# Patient Record
Sex: Male | Born: 1965 | Race: Black or African American | Hispanic: No | Marital: Single | Smoking: Current some day smoker
Health system: Southern US, Community
[De-identification: ages and names within clinical notes are randomized; demographics above are authoritative.]

## PROBLEM LIST (undated history)

## (undated) DIAGNOSIS — J45909 Unspecified asthma, uncomplicated: Secondary | ICD-10-CM

## (undated) DIAGNOSIS — T7840XA Allergy, unspecified, initial encounter: Secondary | ICD-10-CM

## (undated) HISTORY — DX: Unspecified asthma, uncomplicated: J45.909

## (undated) HISTORY — PX: LEG SURGERY: SHX1003

## (undated) HISTORY — DX: Allergy, unspecified, initial encounter: T78.40XA

---

## 1998-11-17 ENCOUNTER — Emergency Department (HOSPITAL_COMMUNITY): Admission: EM | Admit: 1998-11-17 | Discharge: 1998-11-17 | Payer: Self-pay | Admitting: Emergency Medicine

## 2004-10-09 ENCOUNTER — Emergency Department (HOSPITAL_COMMUNITY): Admission: EM | Admit: 2004-10-09 | Discharge: 2004-10-09 | Payer: Self-pay | Admitting: Emergency Medicine

## 2004-12-07 ENCOUNTER — Ambulatory Visit (HOSPITAL_COMMUNITY): Admission: RE | Admit: 2004-12-07 | Discharge: 2004-12-07 | Payer: Self-pay | Admitting: Orthopedic Surgery

## 2004-12-07 ENCOUNTER — Ambulatory Visit (HOSPITAL_BASED_OUTPATIENT_CLINIC_OR_DEPARTMENT_OTHER): Admission: RE | Admit: 2004-12-07 | Discharge: 2004-12-07 | Payer: Self-pay | Admitting: Orthopedic Surgery

## 2008-08-08 ENCOUNTER — Emergency Department (HOSPITAL_COMMUNITY): Admission: EM | Admit: 2008-08-08 | Discharge: 2008-08-09 | Payer: Self-pay | Admitting: Emergency Medicine

## 2008-09-02 ENCOUNTER — Ambulatory Visit (HOSPITAL_BASED_OUTPATIENT_CLINIC_OR_DEPARTMENT_OTHER): Admission: RE | Admit: 2008-09-02 | Discharge: 2008-09-02 | Payer: Self-pay | Admitting: Orthopedic Surgery

## 2009-04-12 ENCOUNTER — Emergency Department (HOSPITAL_COMMUNITY): Admission: EM | Admit: 2009-04-12 | Discharge: 2009-04-12 | Payer: Self-pay | Admitting: Emergency Medicine

## 2009-10-16 ENCOUNTER — Emergency Department (HOSPITAL_COMMUNITY): Admission: EM | Admit: 2009-10-16 | Discharge: 2009-10-16 | Payer: Self-pay | Admitting: Emergency Medicine

## 2010-04-06 LAB — RAPID URINE DRUG SCREEN, HOSP PERFORMED
Amphetamines: NOT DETECTED
Opiates: NOT DETECTED
Tetrahydrocannabinol: NOT DETECTED

## 2010-04-06 LAB — POCT CARDIAC MARKERS
Myoglobin, poc: 85.8 ng/mL (ref 12–200)
Troponin i, poc: <0.05 ng/mL (ref 0.00–0.09)
Troponin i, poc: <0.05 ng/mL (ref 0.00–0.09)

## 2010-04-06 LAB — URINALYSIS, ROUTINE W REFLEX MICROSCOPIC
Bilirubin Urine: NEGATIVE
Hgb urine dipstick: NEGATIVE
Ketones, ur: NEGATIVE mg/dL
Nitrite: NEGATIVE
Protein, ur: NEGATIVE mg/dL
Urobilinogen, UA: 0.2 mg/dL (ref 0.0–1.0)

## 2010-04-06 LAB — DIFFERENTIAL
Basophils Absolute: 0 10*3/uL (ref 0.0–0.1)
Basophils Relative: 1 % (ref 0–1)
Eosinophils Absolute: 0.3 10*3/uL (ref 0.0–0.7)
Eosinophils Relative: 6 % — ABNORMAL HIGH (ref 0–5)
Monocytes Absolute: 0.4 10*3/uL (ref 0.1–1.0)
Monocytes Relative: 7 % (ref 3–12)

## 2010-04-06 LAB — POCT I-STAT, CHEM 8
Calcium, Ion: 1.27 mmol/L (ref 1.12–1.32)
Chloride: 108 mEq/L (ref 96–112)
HCT: 44 % (ref 39.0–52.0)
Potassium: 3.9 mEq/L (ref 3.5–5.1)
Sodium: 141 mEq/L (ref 135–145)

## 2010-04-06 LAB — CBC
HCT: 41 % (ref 39.0–52.0)
Hemoglobin: 14.2 g/dL (ref 13.0–17.0)
MCH: 30.7 pg (ref 26.0–34.0)
MCHC: 34.6 g/dL (ref 30.0–36.0)
RDW: 13.3 % (ref 11.5–15.5)

## 2010-06-06 NOTE — Op Note (Signed)
Shane Townsend, Shane Townsend            ACCOUNT NO.:  [id number]   MEDICAL RECORD NO.:  [id number]          PATIENT TYPE:  AMB   LOCATION:  DSC                          FACILITY:  [facility]   PHYSICIAN:  Rodney A. Mortenson, M.D.DATE OF BIRTH:  July 26, 1965   DATE OF PROCEDURE:  09/02/2008  DATE OF DISCHARGE:                               OPERATIVE REPORT   JUSTIFICATION:  A 43-year male with bilateral carpal tunnel much worse  on the right than on the left.  On electrodiagnostic studies, he has  severe carpal tunnel on the right and he is now admitted for a surgical  decompression.  Complications discussed preoperatively.  Questions  answered and encouraged and the patient wishes to proceed.   JUSTIFICATION FOR OUTPATIENT SURGERY:  Minimal morbidity.   PREOPERATIVE DIAGNOSIS:  Severe right carpal tunnel.   POSTOPERATIVE DIAGNOSIS:  Severe right carpal tunnel.   OPERATION:  Release of transverse carpal ligament, right wrist.   SURGEON:  Rodney A. Chaney Malling, M.D.   ANESTHESIA:  LMA.   PROCEDURE:  The patient was placed on the operating table in supine  position.  Pneumatic tourniquet about the right upper arm.  The right  upper extremity was prepped with DuraPrep, draped in the usual manner.  Loupe magnification was used throughout.  A lazy S incision started in  mid palmar space and carried proximal to the volar wrist crease.  Skin  edges were retracted.  Fascia of forearm was then opened just lateral to  the palmaris and the median nerve was identified and isolated.  Curved  Mayo scissors placed underneath the transverse ligament but above the  median nerve to protect the median nerve.  Under direct vision, the  transverse ligament was seen and this was released directly under loupe  magnification.  Complete decompression of the transverse ligament was  achieved.  This was markedly thickened and the median nerve was  flattened underneath the ligament and there was loss of vascular  markings.  No other space-occupying lesions were seen.  Excellent  decompression distally and proximally was done.  Great care was taken to  extend all border of the carpal canal at the level of the ring finger.  The wound was infiltrated with Marcaine.  The skin edges were closed  with nylon sutures.  A large bulky pressure dressing was applied.  The  patient returned to recovery room in excellent condition.  Technically,  this went extremely well.   DISPOSITION:  1. Percocet for pain.  2. Continue wrist immobilizer.  3. To my office on Wednesday.  4. Usual postop instructions.      Rodney A. Chaney Malling, M.D.  Electronically Signed     RAM/MEDQ  D:  09/02/2008  T:  09/02/2008  Job:  [id number]

## 2010-06-09 NOTE — Op Note (Signed)
NAMEWARNIE, Shane Townsend            ACCOUNT NO.:  [id number]   MEDICAL RECORD NO.:  [id number]          PATIENT TYPE:  AMB   LOCATION:  DSC                          FACILITY:  [facility]   PHYSICIAN:  Rodney A. Mortenson, M.D.DATE OF BIRTH:  1965-04-22   DATE OF PROCEDURE:  12/07/2004  DATE OF DISCHARGE:                                 OPERATIVE REPORT   PREOPERATIVE DIAGNOSIS:  Large hematoma, left thigh, chronic.   POSTOPERATIVE DIAGNOSIS:  Large hematoma, left thigh, chronic.   OPERATION:  Incision and drainage of a large hematoma, left thigh, pulse  lavage, and application of Wound VAC suction system.   SURGEON:  Lenard Galloway. Chaney Malling, M.D.   ASSISTANT:  Legrand Pitts. Duffy, P.A.-C.   ANESTHESIA:  General.   DESCRIPTION OF PROCEDURE:  The patient was placed on the operating table in  the supine position.  After satisfactory general anesthesia, the left thigh  was prepped with DuraPrep and draped out in the usual manner.  An incision  was made from one end of the hematoma to the other in a longitudinal manner.  A large amount of blood was evacuated from the hematoma.  There was a  membrane around the hematoma and it kept recurring.  A pulse lavage was  inserted and the wound was copiously irrigated with saline solution.  A  Wound VAC was then brought to the table.  A series of adhesive tapes and  sheets were applied to the thigh.  The Wound VAC was then cut to fit the  large defect.  This was placed on the flaps.  The suction device was placed  on top and another sheet placed on top to give an air tight seal.  The Wound  VAC pump was then started and the wound collapsed very nicely.  There was an  air tight seal.  Technically, this went extremely care.   FOLLOW UP CARE:  1.  To my office on Wednesday.  2.  The patient is to have changes of the Wound VAC sponge three times a      week at home.  3.  Keflex.           ______________________________  Lenard Galloway Chaney Malling, M.D.     RAM/MEDQ  D:  12/07/2004  T:  12/07/2004  Job:  [id number]

## 2010-08-28 ENCOUNTER — Ambulatory Visit (HOSPITAL_BASED_OUTPATIENT_CLINIC_OR_DEPARTMENT_OTHER)
Admission: RE | Admit: 2010-08-28 | Discharge: 2010-08-28 | Disposition: A | Discharge status: Home / Self Care | Payer: BC Managed Care – PPO | Source: Ambulatory Visit | Attending: Orthopedic Surgery | Admitting: Orthopedic Surgery

## 2010-08-28 DIAGNOSIS — Z01812 Encounter for preprocedural laboratory examination: Secondary | ICD-10-CM | POA: Insufficient documentation

## 2010-08-28 DIAGNOSIS — G5600 Carpal tunnel syndrome, unspecified upper limb: Secondary | ICD-10-CM | POA: Insufficient documentation

## 2010-08-28 DIAGNOSIS — Z1810 Retained metal fragments, unspecified: Secondary | ICD-10-CM | POA: Insufficient documentation

## 2010-08-28 LAB — POCT HEMOGLOBIN-HEMACUE: Hemoglobin: 14.2 g/dL (ref 13.0–17.0)

## 2010-10-24 NOTE — Op Note (Signed)
NAME:  Shane Townsend, Shane Townsend NO.:  [id number]  MEDICAL RECORD NO.:  [id number]  LOCATION:                                 FACILITY:  PHYSICIAN:  Cindee Salt, M.D.            DATE OF BIRTH:  DATE OF PROCEDURE:  08/28/2010 DATE OF DISCHARGE:                              OPERATIVE REPORT   PREOPERATIVE DIAGNOSES:  Carpal tunnel syndrome, left hand.  Foreign body of the left ring finger.  POSTOPERATIVE DIAGNOSES:  Carpal tunnel syndrome, left hand.  Foreign body of the left ring finger.  OPERATION:  Carpal tunnel release with excision of foreign body of the left ring finger, left hand.  SURGEON:  Cindee Salt, MD  ANESTHESIA:  Forearm based IV regional.  ANESTHESIOLOGIST:  Janetta Hora. Gelene Mink, MD  HISTORY:  The patient is a 45 year old male with a history of carpal tunnel syndrome.  EMG nerve conduction is positive which has not responded to conservative treatment.  He has elected to undergo surgical decompression to the median nerve.  He has an incidental finding of a foreign body in his left ring finger and desirous of having this removed.  Pre, peri, and postoperative course have been  discussed along with risks and complications.  He is aware that there is no guarantee with the surgery, possibility of infection, recurrence of injury to arteries, nerves, tendons, incomplete relief of symptoms, and dystrophy. In the preoperative area, the patient is seen, the extremity  marked by both the patient and surgeon, and antibiotic given.  PROCEDURE:  The patient was brought to the operating room where a forearm-based IV regional anesthetic was carried out without difficulty. He was prepped using ChloraPrep, supine position, left arm free.  A 3- minute dry time was allowed.  Time-out taken confirming the patient and procedure.  A longitudinal incision was made in the palm carried down through the subcutaneous tissue.  Bleeders were electrocauterized, palmar fascia  was split, superficial palmar arch identified.  The flexor tendon of the little finger identified to the ulnar side of the median nerve.  The carpal retinaculum was incised with sharp dissection.  Right angle and Sewall retractor were placed between the skin and forearm fascia.  The fascia released for approximately a centimeter and half proximal to the wrist crease under direct vision.  A very significant compression to the median nerve was apparent with an area of hyperemia. Tenosynovial tissue was moderately thickened.  No further lesions were identified.  The wound was irrigated.  Skin closed with interrupted 5-0 Vicryl Rapide sutures.  A separate incision was then made in the mid lateral over the left ring finger, carried down through the subcutaneous tissue.  With blunt dissection, the dissection carried down to a foreign body, this was a BB.  A capsule was present about it and this was opened with blunt dissection and the BB removed along with the capsule.  The wound was irrigated.  The skin closed with interrupted 5-0 Vicryl Rapide sutures.  Local infiltration with 0.25% Marcaine without epinephrine was given to each wound, approximately 7 mL was used.  A sterile compressive dressing was applied to the hand and to the fingers with  the index, middle and thumb free.  On deflation of the tourniquet, all fingers were immediately pinked.  He was taken to the recovery room for observation in satisfactory condition.  He will be discharged home to return to the [facility] [place] in 1 week, on Vicodin.          ______________________________ Cindee Salt, M.D.     GK/MEDQ  D:  08/28/2010  T:  08/29/2010  Job:  [id number]  Electronically Signed by Cindee Salt M.D. on 10/24/2010 04:37:34 PM

## 2011-05-19 ENCOUNTER — Emergency Department (HOSPITAL_COMMUNITY)
Admission: EM | Admit: 2011-05-19 | Discharge: 2011-05-19 | Disposition: A | Discharge status: Home / Self Care | Payer: BC Managed Care – PPO | Attending: Emergency Medicine | Admitting: Emergency Medicine

## 2011-05-19 ENCOUNTER — Emergency Department (HOSPITAL_COMMUNITY): Payer: BC Managed Care – PPO

## 2011-05-19 ENCOUNTER — Encounter (HOSPITAL_COMMUNITY): Payer: Self-pay | Admitting: *Deleted

## 2011-05-19 DIAGNOSIS — M25519 Pain in unspecified shoulder: Secondary | ICD-10-CM | POA: Insufficient documentation

## 2011-05-19 DIAGNOSIS — M25511 Pain in right shoulder: Secondary | ICD-10-CM

## 2011-05-19 MED ORDER — KETOROLAC TROMETHAMINE 30 MG/ML IJ SOLN
30.0000 mg | Freq: Once | INTRAMUSCULAR | Status: AC
Start: 2011-05-19 — End: 2011-05-19
  Administered 2011-05-19: 30 mg via INTRAMUSCULAR
  Filled 2011-05-19: qty 1

## 2011-05-19 MED ORDER — OXYCODONE-ACETAMINOPHEN 5-325 MG PO TABS
1.0000 | ORAL_TABLET | Freq: Four times a day (QID) | ORAL | Status: AC | PRN
Start: 2011-05-19 — End: 2011-05-29

## 2011-05-19 MED ORDER — HYDROMORPHONE HCL PF 1 MG/ML IJ SOLN
1.0000 mg | Freq: Once | INTRAMUSCULAR | Status: AC
  Administered 2011-05-19: 1 mg via INTRAMUSCULAR
  Filled 2011-05-19: qty 1

## 2011-05-19 MED ORDER — IBUPROFEN 600 MG PO TABS: 600.0000 mg | ORAL_TABLET | Freq: Four times a day (QID) | ORAL | Status: AC | PRN

## 2011-05-19 NOTE — ED Notes (Signed)
The pt has  Rt shoulder pain  For 3-4 days.  No known injury

## 2011-05-19 NOTE — Discharge Instructions (Signed)
Shoulder Pain The shoulder is a ball and socket joint. The muscles and tendons (rotator cuff) are what keep the shoulder in its joint and stable. This collection of muscles and tendons holds in the head (ball) of the humerus (upper arm bone) in the fossa (cup) of the scapula (shoulder blade). Today no reason was found for your shoulder pain. Often pain in the shoulder may be treated conservatively with temporary immobilization. For example, holding the shoulder in one place using a sling for rest. Physical therapy may be needed if problems continue. HOME CARE INSTRUCTIONS   Apply ice to the sore area for 15 to 20 minutes, 3 to 4 times per day for the first 2 days. Put the ice in a plastic bag. Place a towel between the bag of ice and your skin.   If you have or were given a shoulder sling and straps, do not remove for as long as directed by your caregiver or until you see a caregiver for a follow-up examination. If you need to remove it to shower or bathe, move your arm as little as possible.   Sleep on several pillows at night to lessen swelling and pain.   Only take over-the-counter or prescription medicines for pain, discomfort, or fever as directed by your caregiver.   Keep any follow-up appointments in order to avoid any type of permanent shoulder disability or chronic pain problems.  SEEK MEDICAL CARE IF:   Pain in your shoulder increases or new pain develops in your arm, hand, or fingers.   Your hand or fingers are colder than your other hand.   You do not obtain pain relief with the medications or your pain becomes worse.  SEEK IMMEDIATE MEDICAL CARE IF:   Your arm, hand, or fingers are numb or tingling.   Your arm, hand, or fingers are swollen, painful, or turn white or blue.   You develop chest pain or shortness of breath.  MAKE SURE YOU:   Understand these instructions.   Will watch your condition.   Will get help right away if you are not doing well or get worse.    Document Released: 10/18/2004 Document Revised: 12/28/2010 Document Reviewed: 12/23/2010 [employer] Patient Information 2012 [employer]. 

## 2011-05-19 NOTE — Progress Notes (Signed)
Orthopedic Tech Progress Note Patient Details:  Shane Townsend 1966/01/10 [id number]  Other Ortho Devices Type of Ortho Device: Other (comment) Ortho Device Location: right UE Ortho Device Interventions: Application   Shane Townsend 05/19/2011, 8:32 AM

## 2011-05-19 NOTE — ED Provider Notes (Signed)
History     CSN: [id number]  Arrival date & time 05/19/11  [id number]   First MD Initiated Contact with Patient 05/19/11 [phone]      Chief Complaint  Patient presents with  . Shoulder Pain    (Consider location/radiation/quality/duration/timing/severity/associated sxs/prior treatment) Patient is a 46 y.o. male presenting with shoulder pain. The history is provided by the patient.  Shoulder Pain This is a new problem.  patient's had right shoulder pain for last 3-4 days. Worse with movement. No trauma. He has not had a history of shoulder pain on that side. No numbness or weakness. He took oxycodone at home without relief. No new or strange physical activities. no Chest pain. No Head or neck pain.  History reviewed. No pertinent past medical history.  History reviewed. No pertinent past surgical history.  No family history on file.  History  Substance Use Topics  . Smoking status: Never Smoker   . Smokeless tobacco: Not on file  . Alcohol Use: Yes      Review of Systems  Constitutional: Negative for fever and fatigue.  Musculoskeletal: Negative for back pain, joint swelling and gait problem.       Right shoulder pain  Neurological: Negative for weakness and numbness.    Allergies  Review of patient's allergies indicates no known allergies.  Home Medications   Current Outpatient Rx  Name Route Sig Dispense Refill  . IBUPROFEN 600 MG PO TABS Oral Take 1 tablet (600 mg total) by mouth every 6 (six) hours as needed for pain. 20 tablet 0  . OXYCODONE-ACETAMINOPHEN 5-325 MG PO TABS Oral Take 1-2 tablets by mouth every 6 (six) hours as needed for pain. 20 tablet 0    BP 169/98  Pulse 80  Temp(Src) 98 F (36.7 C) (Oral)  Resp 16  SpO2 95%  Physical Exam  Musculoskeletal:       Decreased range of motion right shoulder. Tenderness anteriorly. Pain with active or passive movement. Neurovascular intact distally. No tenderness over trapezius  Neurological: He is alert.    Skin: Skin is warm. No rash noted.    ED Course  Procedures (including critical care time)  Labs Reviewed - No data to display Dg Shoulder Right  05/19/2011  *RADIOLOGY REPORT*  Clinical Data: Anterior shoulder pain for 3 days.  No known injury.  RIGHT SHOULDER - 2+ VIEW  Comparison: None.  Findings: The mineralization and alignment are normal.  There is no evidence of acute fracture or dislocation.  The subacromial space is preserved.  There are mild acromioclavicular degenerative changes.  IMPRESSION: No acute osseous findings.  Original Report Authenticated By: Gerrianne Scale, M.D.     1. Shoulder pain, right       MDM  Right shoulder pain. Tender anteriorly. Negative x-ray. No clear injury. Patient is neurovascular intact. He'll be given anti-inflammatories and pain meds. He'll be given a shoulder sling to use as needed for comfort for next few days. He'll follow with orthopedics as needed.        Juliet Rude. Rubin Payor, MD 05/19/11 [phone]

## 2012-11-13 IMAGING — CR DG SHOULDER 2+V*R*
3 series · 3 of 3 positions shown · non-contrast
Comparison: None.

CLINICAL DATA: Anterior shoulder pain for 3 days.  No known injury.

RIGHT SHOULDER - 2+ VIEW

[w shoulder ap external righ]
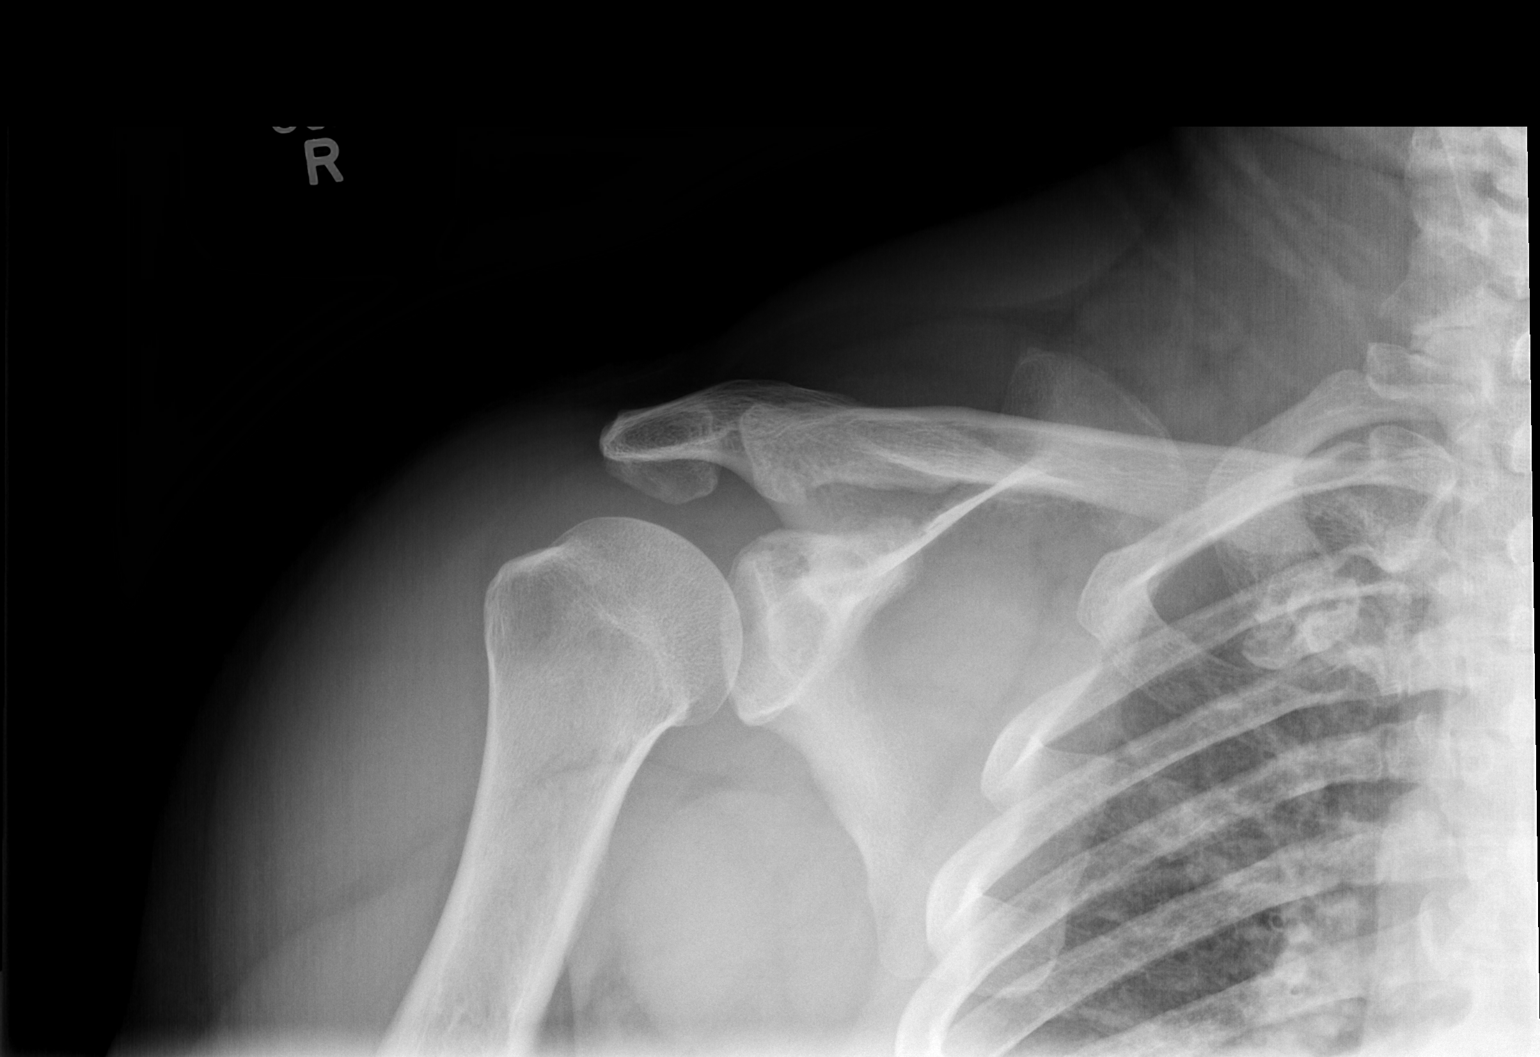

[w shoulder ap internal righ]
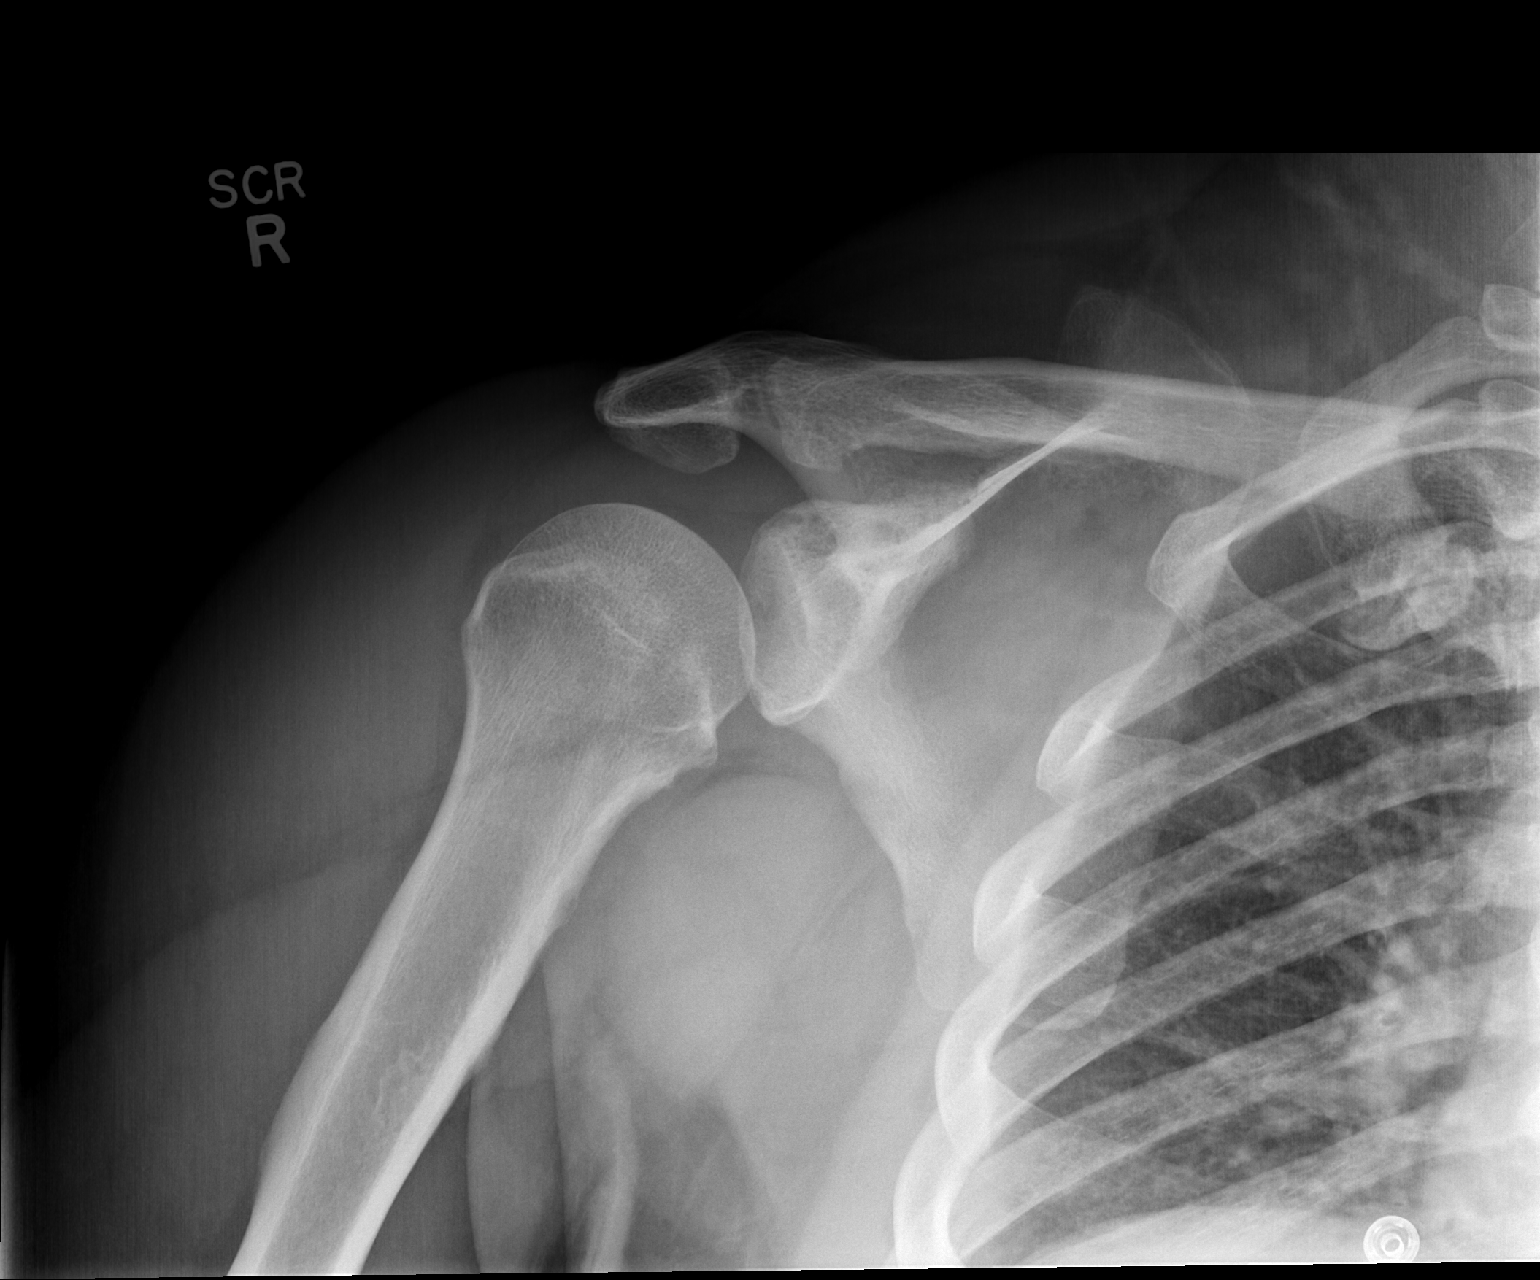

[w shoulder y view right]
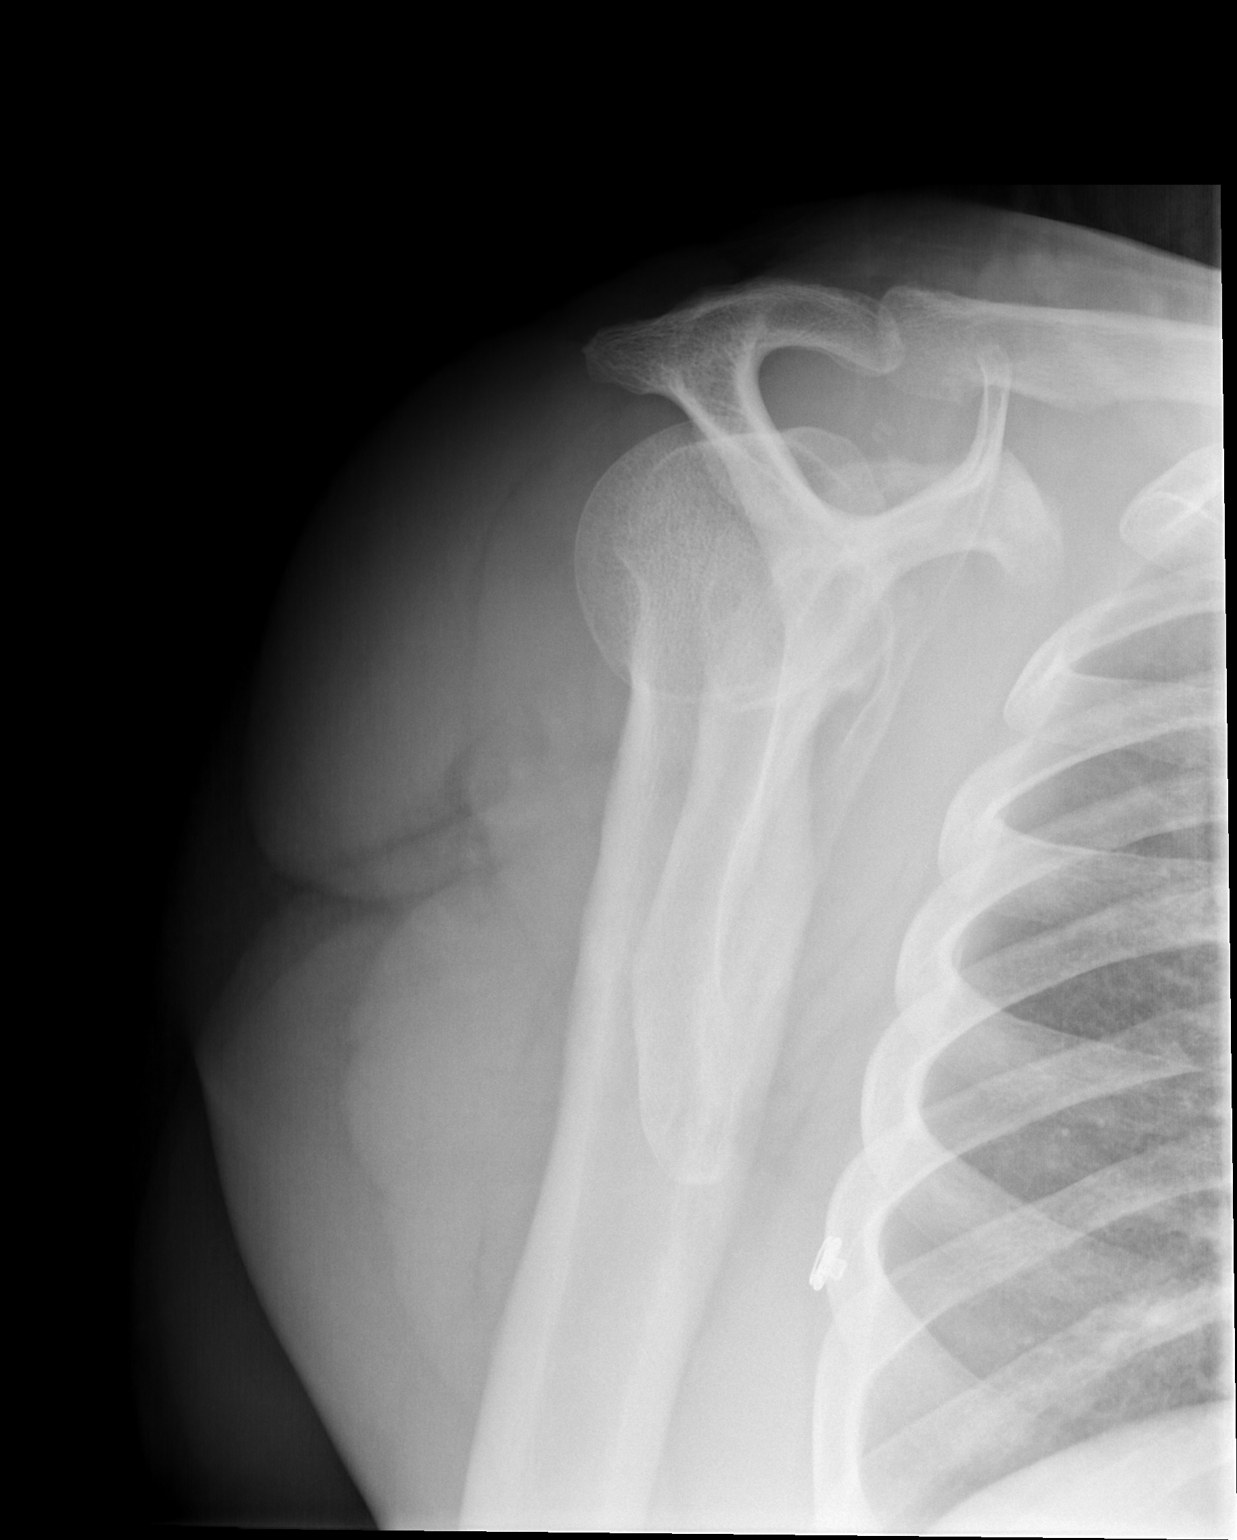

[3 of 3 positions shown; findings below may reference images not displayed]

FINDINGS: The mineralization and alignment are normal.  There is no
evidence of acute fracture or dislocation.  The subacromial space
is preserved.  There are mild acromioclavicular degenerative
changes.
IMPRESSION: No acute osseous findings.

## 2013-06-04 ENCOUNTER — Ambulatory Visit (INDEPENDENT_AMBULATORY_CARE_PROVIDER_SITE_OTHER): Payer: BC Managed Care – PPO | Admitting: Family Medicine

## 2013-06-04 ENCOUNTER — Encounter: Payer: Self-pay | Admitting: Gastroenterology

## 2013-06-04 VITALS — BP 126/74 | HR 64 | Temp 98.1°F | Resp 16 | Ht 62.5 in | Wt 234.2 lb

## 2013-06-04 DIAGNOSIS — R369 Urethral discharge, unspecified: Secondary | ICD-10-CM

## 2013-06-04 DIAGNOSIS — K625 Hemorrhage of anus and rectum: Secondary | ICD-10-CM

## 2013-06-04 DIAGNOSIS — E669 Obesity, unspecified: Secondary | ICD-10-CM

## 2013-06-04 DIAGNOSIS — Z800 Family history of malignant neoplasm of digestive organs: Secondary | ICD-10-CM

## 2013-06-04 DIAGNOSIS — Z113 Encounter for screening for infections with a predominantly sexual mode of transmission: Secondary | ICD-10-CM

## 2013-06-04 LAB — POCT UA - MICROSCOPIC ONLY
Bacteria, U Microscopic: NEGATIVE
Casts, Ur, LPF, POC: NEGATIVE
Crystals, Ur, HPF, POC: NEGATIVE
EPITHELIAL CELLS, URINE PER MICROSCOPY: NEGATIVE
RBC, URINE, MICROSCOPIC: NEGATIVE
YEAST UA: NEGATIVE

## 2013-06-04 LAB — POCT CBC
GRANULOCYTE PERCENT: 50.4 % (ref 37–80)
HEMATOCRIT: 46.7 % (ref 43.5–53.7)
Hemoglobin: 15.3 g/dL (ref 14.1–18.1)
Lymph, poc: 2.3 (ref 0.6–3.4)
MCH, POC: 30.4 pg (ref 27–31.2)
MCHC: 32.8 g/dL (ref 31.8–35.4)
MCV: 92.7 fL (ref 80–97)
MID (cbc): 0.4 (ref 0–0.9)
MPV: 9.3 fL (ref 0–99.8)
POC GRANULOCYTE: 2.7 (ref 2–6.9)
POC LYMPH %: 42.4 % (ref 10–50)
POC MID %: 7.2 % (ref 0–12)
Platelet Count, POC: 278 10*3/uL (ref 142–424)
RBC: 5.04 M/uL (ref 4.69–6.13)
RDW, POC: 14.3 %
WBC: 5.4 10*3/uL (ref 4.6–10.2)

## 2013-06-04 LAB — POCT URINALYSIS DIPSTICK
BILIRUBIN UA: NEGATIVE
Blood, UA: NEGATIVE
GLUCOSE UA: NEGATIVE
KETONES UA: NEGATIVE
LEUKOCYTES UA: NEGATIVE
NITRITE UA: NEGATIVE
Protein, UA: NEGATIVE
Spec Grav, UA: >=1.03
Urobilinogen, UA: 0.2
pH, UA: 5

## 2013-06-04 LAB — RPR

## 2013-06-04 LAB — HEPATITIS C ANTIBODY: HCV Ab: NEGATIVE

## 2013-06-04 LAB — HIV ANTIBODY (ROUTINE TESTING W REFLEX): HIV 1&2 Ab, 4th Generation: NONREACTIVE

## 2013-06-04 LAB — HEPATITIS B SURFACE ANTIBODY, QUANTITATIVE: HEPATITIS B-POST: 0 m[IU]/mL

## 2013-06-04 LAB — HEPATITIS B SURFACE ANTIGEN: HEP B S AG: NEGATIVE

## 2013-06-04 NOTE — Patient Instructions (Addendum)
We will get you set up to see GI.  Be sure that you do attend this appointment.  I will be in touch with the rest of your labs in the next couple of days.  Avoid sex until we talk

## 2013-06-04 NOTE — Progress Notes (Signed)
[facility] [address], [place] [address] [phone]  Date:  06/04/2013   Name:  Shane Townsend   DOB:  1965-04-15   MRN:  [id number]  PCP:  No primary provider on file.    Chief Complaint: Referral   History of Present Illness:  Shane Townsend is a 48 y.o. very pleasant male patient who presents with the following:  He needs a referral for a colonoscopy.  His father had colon cancer- he is still living,  He is not quite sure of his age at dx.   He has noted some blood in his stool for about a year.  He may notice some bright red blood on the TP some of the time.   No abdominal pain, no weight loss.   He has not yet had a colonoscopy He does not have any particular preference for GI doctor  He has noted some itching and discharge from the penis for a couple of days.  He does not have dysuria.  He does not have frequency, no fever.    He is otherwise generally healthy.  He is SA with his GF.   There are no active problems to display for this patient.   Past Medical History  Diagnosis Date  . Allergy   . Asthma     Past Surgical History  Procedure Laterality Date  . Leg surgery      History  Substance Use Topics  . Smoking status: Current Some Day Smoker  . Smokeless tobacco: Not on file  . Alcohol Use: Yes    History reviewed. No pertinent family history.  No Known Allergies  Medication list has been reviewed and updated.  No current outpatient prescriptions on file prior to visit.   No current facility-administered medications on file prior to visit.    Review of Systems:  As per HPI- otherwise negative.   Physical Examination: Filed Vitals:   06/04/13 0928  BP: 126/74  Pulse: 64  Temp: 98.1 F (36.7 C)  Resp: 16   Filed Vitals:   06/04/13 0928  Height: 5' 2.5" (1.588 m)  Weight: 234 lb 3.2 oz (106.232 kg)   Body mass index is 42.13 kg/(m^2). Ideal Body Weight: Weight in (lb) to have BMI = 25:  138.6  GEN: WDWN, NAD, Non-toxic, A & O x 3, large build, looks well HEENT: Atraumatic, Normocephalic. Neck supple. No masses, No LAD.   Ears and Nose: No external deformity. CV: RRR, No M/G/R. No JVD. No thrill. No extra heart sounds. PULM: CTA B, no wheezes, crackles, rhonchi. No retractions. No resp. distress. No accessory muscle use. ABD: S, NT, ND, +BS. No rebound. No HSM. EXTR: No c/c/e NEURO Normal gait.  PSYCH: Normally interactive. Conversant. Not depressed or anxious appearing.  Calm demeanor.  Gu: there is a scant amount of whitish discharge from the penis.  Otherwise normal GU exam, no masses  Results for orders placed in visit on 06/04/13  POCT CBC      Result Value Ref Range   WBC 5.4  4.6 - 10.2 K/uL   Lymph, poc 2.3  0.6 - 3.4   POC LYMPH PERCENT 42.4  10 - 50 %L   MID (cbc) 0.4  0 - 0.9   POC MID % 7.2  0 - 12 %M   POC Granulocyte 2.7  2 - 6.9   Granulocyte percent 50.4  37 - 80 %G   RBC 5.04  4.69 - 6.13 M/uL  Hemoglobin 15.3  14.1 - 18.1 g/dL   HCT, POC 16.146.7  09.643.5 - 53.7 %   MCV 92.7  80 - 97 fL   MCH, POC 30.4  27 - 31.2 pg   MCHC 32.8  31.8 - 35.4 g/dL   RDW, POC 04.514.3     Platelet Count, POC 278  142 - 424 K/uL   MPV 9.3  0 - 99.8 fL  POCT UA - MICROSCOPIC ONLY      Result Value Ref Range   WBC, Ur, HPF, POC 0-4     RBC, urine, microscopic neg     Bacteria, U Microscopic neg     Mucus, UA trace     Epithelial cells, urine per micros neg     Crystals, Ur, HPF, POC neg     Casts, Ur, LPF, POC neg     Yeast, UA neg    POCT URINALYSIS DIPSTICK      Result Value Ref Range   Color, UA yellow     Clarity, UA clear     Glucose, UA neg     Bilirubin, UA neg     Ketones, UA neg     Spec Grav, UA >=1.030     Blood, UA neg     pH, UA 5.0     Protein, UA neg     Urobilinogen, UA 0.2     Nitrite, UA neg     Leukocytes, UA Negative        Assessment and Plan: Bright red blood per rectum - Plan: POCT CBC, Ambulatory referral to Gastroenterology,  CANCELED: Ambulatory referral to Gastroenterology  Routine screening for STI (sexually transmitted infection) - Plan: Hepatitis B surface antibody, Hepatitis B surface antigen, Hepatitis C antibody, HIV antibody, RPR  Penile discharge - Plan: POCT UA - Microscopic Only, POCT urinalysis dipstick, GC/Chlamydia Probe Amp, Urine culture  Family history of colon cancer - Plan: Ambulatory referral to Gastroenterology, CANCELED: Ambulatory referral to Gastroenterology  Referral to GI for colonoscopy Check STI screening labs  Suspect he may have gonorrhea or chlamydia- await results.  No sex until results in  Signed Abbe AmsterdamJessica Copland, MD

## 2013-06-05 LAB — GC/CHLAMYDIA PROBE AMP
CT PROBE, AMP APTIMA: NEGATIVE
GC PROBE AMP APTIMA: NEGATIVE

## 2013-06-05 LAB — URINE CULTURE
Colony Count: NO GROWTH
Organism ID, Bacteria: NO GROWTH

## 2013-06-06 ENCOUNTER — Encounter: Payer: Self-pay | Admitting: Family Medicine

## 2013-06-06 ENCOUNTER — Telehealth: Payer: Self-pay | Admitting: Family Medicine

## 2013-06-06 DIAGNOSIS — R369 Urethral discharge, unspecified: Secondary | ICD-10-CM

## 2013-06-06 MED ORDER — AZITHROMYCIN 250 MG PO TABS: ORAL_TABLET | ORAL | Status: AC

## 2013-06-06 NOTE — Telephone Encounter (Signed)
Called to go over his labs; all negative. He is still having some clear to white discharge.  Will treat with azithromycin- added note to his lab letter to have his GF get tested as well.  Possible chlamydia?  Let me know if not better

## 2013-08-03 ENCOUNTER — Ambulatory Visit: Payer: BC Managed Care – PPO | Admitting: Gastroenterology
